# Patient Record
Sex: Male | Born: 1995 | Race: White | Hispanic: No | Marital: Single | Smoking: Never smoker
Health system: Southern US, Community
[De-identification: ages and names within clinical notes are randomized; demographics above are authoritative.]

---
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
## 2017-04-30 DIAGNOSIS — Z0000 Encounter for general adult medical examination without abnormal findings: Secondary | ICD-10-CM | POA: Diagnosis not present

## 2017-04-30 DIAGNOSIS — R7301 Impaired fasting glucose: Secondary | ICD-10-CM | POA: Diagnosis not present

## 2017-09-26 DIAGNOSIS — B360 Pityriasis versicolor: Secondary | ICD-10-CM | POA: Diagnosis not present

## 2017-09-26 DIAGNOSIS — D1801 Hemangioma of skin and subcutaneous tissue: Secondary | ICD-10-CM | POA: Diagnosis not present

## 2019-09-04 DIAGNOSIS — R7301 Impaired fasting glucose: Secondary | ICD-10-CM | POA: Diagnosis not present

## 2019-09-04 DIAGNOSIS — R7989 Other specified abnormal findings of blood chemistry: Secondary | ICD-10-CM | POA: Diagnosis not present

## 2019-09-04 DIAGNOSIS — Z0000 Encounter for general adult medical examination without abnormal findings: Secondary | ICD-10-CM | POA: Diagnosis not present

## 2019-09-04 DIAGNOSIS — E663 Overweight: Secondary | ICD-10-CM | POA: Diagnosis not present

## 2019-09-04 DIAGNOSIS — E7439 Other disorders of intestinal carbohydrate absorption: Secondary | ICD-10-CM | POA: Diagnosis not present

## 2020-03-24 ENCOUNTER — Ambulatory Visit
Admission: EM | Admit: 2020-03-24 | Discharge: 2020-03-24 | Disposition: A | Discharge status: Home / Self Care | Payer: BC Managed Care – PPO | Attending: Family Medicine | Admitting: Family Medicine

## 2020-03-24 ENCOUNTER — Encounter: Payer: Self-pay | Admitting: Emergency Medicine

## 2020-03-24 ENCOUNTER — Other Ambulatory Visit: Payer: Self-pay

## 2020-03-24 DIAGNOSIS — L239 Allergic contact dermatitis, unspecified cause: Secondary | ICD-10-CM

## 2020-03-24 DIAGNOSIS — R21 Rash and other nonspecific skin eruption: Secondary | ICD-10-CM

## 2020-03-24 MED ORDER — PREDNISONE 10 MG (21) PO TBPK: ORAL_TABLET | Freq: Every day | ORAL | 0 refills | Status: AC

## 2020-03-24 MED ORDER — HYDROXYZINE HCL 25 MG PO TABS: 25.0000 mg | ORAL_TABLET | Freq: Four times a day (QID) | ORAL | 0 refills | Status: AC

## 2020-03-24 MED ORDER — DEXAMETHASONE SODIUM PHOSPHATE 10 MG/ML IJ SOLN
10.0000 mg | Freq: Once | INTRAMUSCULAR | Status: AC
  Administered 2020-03-24: 10 mg via INTRAMUSCULAR

## 2020-03-24 NOTE — ED Provider Notes (Signed)
[place]   [id number] 03/24/20 Arrival Time: 57  CC: RASH  SUBJECTIVE:  Kyle Holmes is a 25 y.o. male who presents with a skin complaint that began about a week ago.  Reports that he did use a laundry additive last Monday.  States that he has since rewashed everything in his normal laundry detergent.  Reports that there is a rash from almost head to toe.  It is most concentrated where he is wears clothing.  Reports that the rash is red raised and itchy.  Reports that the itching has been keeping him up at night.  He has been taking Benadryl with little relief.  Has never had symptoms like this before.   Denies fever, chills, nausea, vomiting, swelling, discharge, oral lesions, SOB, chest pain, abdominal pain, changes in bowel or bladder function.    ROS: As per HPI.  All other pertinent ROS negative.     History reviewed. No pertinent past medical history. History reviewed. No pertinent surgical history. No Known Allergies No current facility-administered medications on file prior to encounter.   No current outpatient medications on file prior to encounter.   Social History   Socioeconomic History  . Marital status: Single    Spouse name: Not on file  . Number of children: Not on file  . Years of education: Not on file  . Highest education level: Not on file  Occupational History  . Not on file  Tobacco Use  . Smoking status: Never Smoker  . Smokeless tobacco: Never Used  Substance and Sexual Activity  . Alcohol use: Yes    Comment: occ  . Drug use: Never  . Sexual activity: Not on file  Other Topics Concern  . Not on file  Social History Narrative  . Not on file   Social Determinants of Health   Financial Resource Strain: Not on file  Food Insecurity: Not on file  Transportation Needs: Not on file  Physical Activity: Not on file  Stress: Not on file  Social Connections: Not on file  Intimate Partner Violence: Not on file   No family history on  file.  OBJECTIVE: There were no vitals filed for this visit.  General appearance: alert; no distress Head: NCAT Lungs: clear to auscultation bilaterally Heart: regular rate and rhythm.  Radial pulse 2+ bilaterally Extremities: no edema Skin: warm and dry; erythematous maculopapular rash noted to bilateral arms, trunk, back, legs, also a patch to right scalp psychological: alert and cooperative; normal mood and affect  ASSESSMENT & PLAN:  1. Allergic dermatitis   2. Rash and nonspecific skin eruption     Meds ordered this encounter  Medications  . dexamethasone (DECADRON) injection 10 mg  . hydrOXYzine (ATARAX/VISTARIL) 25 MG tablet    Sig: Take 1 tablet (25 mg total) by mouth every 6 (six) hours.    Dispense:  12 tablet    Refill:  0    Order Specific Question:   Supervising Provider    Answer:   Chase Picket [id number]  . predniSONE (STERAPRED UNI-PAK 21 TAB) 10 MG (21) TBPK tablet    Sig: Take by mouth daily for 6 days. Take 6 tablets on day 1, 5 tablets on day 2, 4 tablets on day 3, 3 tablets on day 4, 2 tablets on day 5, 1 tablet on day 6    Dispense:  21 tablet    Refill:  0    Order Specific Question:   Supervising Provider  Answer:   Chase Picket [[id number]]   Decadron 10 mg IM given in office today Hydroxyzine prescribed Steroid taper prescribed Information for allergist provided Take as prescribed and to completion Avoid hot showers/ baths Moisturize skin daily  Follow up with PCP if symptoms persists Return or go to the ER if you have any new or worsening symptoms such as fever, chills, nausea, vomiting, redness, swelling, discharge, if symptoms do not improve with medications  Reviewed expectations re: course of current medical issues. Questions answered. Outlined signs and symptoms indicating need for more acute intervention. Patient verbalized understanding. After Visit Summary given.   Faustino Congress, NP 03/24/20 1624

## 2020-03-24 NOTE — Discharge Instructions (Signed)
You have received a steroid injection in the office today.  I have sent in hydroxyzine for you to take at night.  This will help with the itching.  We will also make you sleepy.  Do not drive or operate heavy machinery while you are taking this medication.  I have sent in a prednisone taper for you to take for 6 days. 6 tablets on day one, 5 tablets on day two, 4 tablets on day three, 3 tablets on day four, 2 tablets on day five, and 1 tablet on day six.  Start the prednisone taper tomorrow  I have attached information for asthma and allergy  Follow up with this office or with primary care if symptoms are persisting.  Follow up in the ER for high fever, trouble swallowing, trouble breathing, other concerning symptoms.

## 2020-03-24 NOTE — ED Triage Notes (Addendum)
Hives on different parts of body since Monday.  Has taken benadryl and cortisone cream with minimal relief.

## 2020-04-01 DIAGNOSIS — C44319 Basal cell carcinoma of skin of other parts of face: Secondary | ICD-10-CM | POA: Diagnosis not present

## 2020-04-01 DIAGNOSIS — Z1283 Encounter for screening for malignant neoplasm of skin: Secondary | ICD-10-CM | POA: Diagnosis not present

## 2020-04-01 DIAGNOSIS — B360 Pityriasis versicolor: Secondary | ICD-10-CM | POA: Diagnosis not present

## 2020-04-01 DIAGNOSIS — D225 Melanocytic nevi of trunk: Secondary | ICD-10-CM | POA: Diagnosis not present

## 2020-04-01 DIAGNOSIS — L508 Other urticaria: Secondary | ICD-10-CM | POA: Diagnosis not present

## 2020-08-06 DIAGNOSIS — C44319 Basal cell carcinoma of skin of other parts of face: Secondary | ICD-10-CM | POA: Diagnosis not present

## 2020-08-31 DIAGNOSIS — L503 Dermatographic urticaria: Secondary | ICD-10-CM | POA: Diagnosis not present

## 2020-08-31 DIAGNOSIS — J301 Allergic rhinitis due to pollen: Secondary | ICD-10-CM | POA: Diagnosis not present

## 2020-09-02 DIAGNOSIS — C44319 Basal cell carcinoma of skin of other parts of face: Secondary | ICD-10-CM | POA: Diagnosis not present

## 2020-09-15 DIAGNOSIS — R82998 Other abnormal findings in urine: Secondary | ICD-10-CM | POA: Diagnosis not present

## 2022-07-31 DIAGNOSIS — R7989 Other specified abnormal findings of blood chemistry: Secondary | ICD-10-CM | POA: Diagnosis not present

## 2022-07-31 DIAGNOSIS — Z0000 Encounter for general adult medical examination without abnormal findings: Secondary | ICD-10-CM | POA: Diagnosis not present

## 2022-07-31 DIAGNOSIS — R7301 Impaired fasting glucose: Secondary | ICD-10-CM | POA: Diagnosis not present

## 2022-08-09 DIAGNOSIS — Z1331 Encounter for screening for depression: Secondary | ICD-10-CM | POA: Diagnosis not present

## 2022-08-09 DIAGNOSIS — R7301 Impaired fasting glucose: Secondary | ICD-10-CM | POA: Diagnosis not present

## 2022-08-09 DIAGNOSIS — Z0000 Encounter for general adult medical examination without abnormal findings: Secondary | ICD-10-CM | POA: Diagnosis not present

## 2022-08-09 DIAGNOSIS — R82998 Other abnormal findings in urine: Secondary | ICD-10-CM | POA: Diagnosis not present

## 2022-08-09 DIAGNOSIS — E663 Overweight: Secondary | ICD-10-CM | POA: Diagnosis not present

## 2022-09-15 DIAGNOSIS — H52223 Regular astigmatism, bilateral: Secondary | ICD-10-CM | POA: Diagnosis not present

## 2023-08-10 DIAGNOSIS — R7989 Other specified abnormal findings of blood chemistry: Secondary | ICD-10-CM | POA: Diagnosis not present

## 2023-08-10 DIAGNOSIS — Z0000 Encounter for general adult medical examination without abnormal findings: Secondary | ICD-10-CM | POA: Diagnosis not present

## 2023-08-10 DIAGNOSIS — R7301 Impaired fasting glucose: Secondary | ICD-10-CM | POA: Diagnosis not present

## 2023-08-15 DIAGNOSIS — R82998 Other abnormal findings in urine: Secondary | ICD-10-CM | POA: Diagnosis not present

## 2023-08-15 DIAGNOSIS — Z1331 Encounter for screening for depression: Secondary | ICD-10-CM | POA: Diagnosis not present

## 2023-08-15 DIAGNOSIS — Z0000 Encounter for general adult medical examination without abnormal findings: Secondary | ICD-10-CM | POA: Diagnosis not present

## 2023-08-15 DIAGNOSIS — Z23 Encounter for immunization: Secondary | ICD-10-CM | POA: Diagnosis not present

## 2023-08-15 DIAGNOSIS — R7301 Impaired fasting glucose: Secondary | ICD-10-CM | POA: Diagnosis not present

## 2023-12-20 DIAGNOSIS — H04123 Dry eye syndrome of bilateral lacrimal glands: Secondary | ICD-10-CM | POA: Diagnosis not present
